# Patient Record
Sex: Female | Born: 1965 | Race: Asian | Hispanic: No | Marital: Married | Smoking: Never smoker
Health system: Southern US, Community
[De-identification: ages and names within clinical notes are randomized; demographics above are authoritative.]

## PROBLEM LIST (undated history)

## (undated) DIAGNOSIS — I8390 Asymptomatic varicose veins of unspecified lower extremity: Secondary | ICD-10-CM

## (undated) HISTORY — DX: Asymptomatic varicose veins of unspecified lower extremity: I83.90

---

## 2004-06-19 ENCOUNTER — Other Ambulatory Visit: Admission: RE | Admit: 2004-06-19 | Discharge: 2004-06-19 | Payer: Self-pay | Admitting: Internal Medicine

## 2005-08-28 ENCOUNTER — Other Ambulatory Visit: Admission: RE | Admit: 2005-08-28 | Discharge: 2005-08-28 | Payer: Self-pay | Admitting: Obstetrics and Gynecology

## 2006-09-03 ENCOUNTER — Other Ambulatory Visit: Admission: RE | Admit: 2006-09-03 | Discharge: 2006-09-03 | Payer: Self-pay | Admitting: Obstetrics and Gynecology

## 2007-09-10 ENCOUNTER — Other Ambulatory Visit: Admission: RE | Admit: 2007-09-10 | Discharge: 2007-09-10 | Payer: Self-pay | Admitting: Obstetrics and Gynecology

## 2007-10-13 ENCOUNTER — Encounter: Admission: RE | Admit: 2007-10-13 | Discharge: 2007-10-13 | Payer: Self-pay | Admitting: Obstetrics and Gynecology

## 2008-08-26 ENCOUNTER — Other Ambulatory Visit: Admission: RE | Admit: 2008-08-26 | Discharge: 2008-08-26 | Payer: Self-pay | Admitting: Obstetrics and Gynecology

## 2008-10-13 ENCOUNTER — Encounter: Admission: RE | Admit: 2008-10-13 | Discharge: 2008-10-13 | Payer: Self-pay | Admitting: Obstetrics and Gynecology

## 2009-08-31 ENCOUNTER — Other Ambulatory Visit: Admission: RE | Admit: 2009-08-31 | Discharge: 2009-08-31 | Payer: Self-pay | Admitting: Obstetrics and Gynecology

## 2009-10-14 ENCOUNTER — Encounter: Admission: RE | Admit: 2009-10-14 | Discharge: 2009-10-14 | Payer: Self-pay | Admitting: Obstetrics and Gynecology

## 2010-07-10 ENCOUNTER — Encounter: Payer: Self-pay | Admitting: Cardiology

## 2010-07-10 DIAGNOSIS — I471 Supraventricular tachycardia: Secondary | ICD-10-CM | POA: Insufficient documentation

## 2010-08-13 ENCOUNTER — Encounter: Payer: Self-pay | Admitting: Obstetrics and Gynecology

## 2010-08-24 NOTE — Miscellaneous (Signed)
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

## 2010-09-11 ENCOUNTER — Other Ambulatory Visit: Payer: Self-pay | Admitting: Obstetrics and Gynecology

## 2010-09-11 DIAGNOSIS — Z1231 Encounter for screening mammogram for malignant neoplasm of breast: Secondary | ICD-10-CM

## 2010-10-18 ENCOUNTER — Ambulatory Visit
Admission: RE | Admit: 2010-10-18 | Discharge: 2010-10-18 | Disposition: A | Discharge status: Home / Self Care | Payer: BC Managed Care – PPO | Source: Ambulatory Visit | Attending: Obstetrics and Gynecology | Admitting: Obstetrics and Gynecology

## 2010-10-18 DIAGNOSIS — Z1231 Encounter for screening mammogram for malignant neoplasm of breast: Secondary | ICD-10-CM

## 2011-09-10 ENCOUNTER — Other Ambulatory Visit: Payer: Self-pay | Admitting: Internal Medicine

## 2011-09-10 DIAGNOSIS — Z1231 Encounter for screening mammogram for malignant neoplasm of breast: Secondary | ICD-10-CM

## 2011-10-30 ENCOUNTER — Ambulatory Visit
Admission: RE | Admit: 2011-10-30 | Discharge: 2011-10-30 | Disposition: A | Discharge status: Home / Self Care | Payer: BC Managed Care – PPO | Source: Ambulatory Visit | Attending: Internal Medicine | Admitting: Internal Medicine

## 2011-10-30 DIAGNOSIS — Z1231 Encounter for screening mammogram for malignant neoplasm of breast: Secondary | ICD-10-CM

## 2011-11-02 ENCOUNTER — Other Ambulatory Visit: Payer: Self-pay | Admitting: Internal Medicine

## 2011-11-02 DIAGNOSIS — R928 Other abnormal and inconclusive findings on diagnostic imaging of breast: Secondary | ICD-10-CM

## 2011-11-06 ENCOUNTER — Ambulatory Visit
Admission: RE | Admit: 2011-11-06 | Discharge: 2011-11-06 | Disposition: A | Discharge status: Home / Self Care | Payer: BC Managed Care – PPO | Source: Ambulatory Visit | Attending: Internal Medicine | Admitting: Internal Medicine

## 2011-11-06 DIAGNOSIS — R928 Other abnormal and inconclusive findings on diagnostic imaging of breast: Secondary | ICD-10-CM

## 2012-10-09 ENCOUNTER — Other Ambulatory Visit: Payer: Self-pay

## 2012-10-09 DIAGNOSIS — Z1231 Encounter for screening mammogram for malignant neoplasm of breast: Secondary | ICD-10-CM

## 2012-11-07 ENCOUNTER — Ambulatory Visit
Admission: RE | Admit: 2012-11-07 | Discharge: 2012-11-07 | Disposition: A | Discharge status: Home / Self Care | Payer: BC Managed Care – PPO | Source: Ambulatory Visit

## 2012-11-07 DIAGNOSIS — Z1231 Encounter for screening mammogram for malignant neoplasm of breast: Secondary | ICD-10-CM

## 2013-07-06 ENCOUNTER — Other Ambulatory Visit: Payer: Self-pay | Admitting: *Deleted

## 2013-07-06 DIAGNOSIS — I83893 Varicose veins of bilateral lower extremities with other complications: Secondary | ICD-10-CM

## 2013-07-08 ENCOUNTER — Encounter: Payer: Self-pay | Admitting: Vascular Surgery

## 2013-07-09 ENCOUNTER — Inpatient Hospital Stay (HOSPITAL_COMMUNITY): Admission: RE | Admit: 2013-07-09 | Payer: BC Managed Care – PPO | Source: Ambulatory Visit

## 2013-07-09 ENCOUNTER — Encounter: Payer: BC Managed Care – PPO | Admitting: Vascular Surgery

## 2015-05-18 ENCOUNTER — Other Ambulatory Visit: Payer: Self-pay | Admitting: *Deleted

## 2015-05-18 DIAGNOSIS — I83893 Varicose veins of bilateral lower extremities with other complications: Secondary | ICD-10-CM

## 2015-05-19 ENCOUNTER — Encounter: Payer: Self-pay | Admitting: Vascular Surgery

## 2015-05-19 ENCOUNTER — Ambulatory Visit (HOSPITAL_COMMUNITY)
Admission: RE | Admit: 2015-05-19 | Discharge: 2015-05-19 | Disposition: A | Discharge status: Home / Self Care | Payer: BLUE CROSS/BLUE SHIELD | Source: Ambulatory Visit | Attending: Vascular Surgery | Admitting: Vascular Surgery

## 2015-05-19 ENCOUNTER — Ambulatory Visit (INDEPENDENT_AMBULATORY_CARE_PROVIDER_SITE_OTHER): Payer: BLUE CROSS/BLUE SHIELD | Admitting: Vascular Surgery

## 2015-05-19 VITALS — BP 111/63 | HR 69 | Ht 63.0 in | Wt 136.7 lb

## 2015-05-19 DIAGNOSIS — I83893 Varicose veins of bilateral lower extremities with other complications: Secondary | ICD-10-CM | POA: Diagnosis not present

## 2015-05-19 DIAGNOSIS — I83813 Varicose veins of bilateral lower extremities with pain: Secondary | ICD-10-CM | POA: Diagnosis not present

## 2015-05-19 NOTE — Progress Notes (Signed)
[facility] [place] HISTORY AND PHYSICAL   History of Present Illness:  Patient is a 49 y.o. year old female who presents for evaluation of symptomatic varicose veins. The patient has had a history of varicose veins for several years. Over time they have gotten larger. She has a burning warm sensation that occurs frequently an almost continuously in the left medial leg. She denies prior history of DVT. She has no family history of varicose veins. She has not worn compression stockings in the past..   Past medical history: No significant medical problems  Past surgical history: No significant surgical history Social History Social History  Substance Use Topics  . Smoking status: Never Smoker   . Smokeless tobacco: None  . Alcohol Use: 0.0 oz/week    0 Standard drinks or equivalent per week    Family History No family history on file.  Allergies  No Known Allergies   No current outpatient prescriptions on file.   No current facility-administered medications for this visit.    ROS:   General:  No weight loss, Fever, chills  HEENT: No recent headaches, no nasal bleeding, no visual changes, no sore throat  Neurologic: No dizziness, blackouts, seizures. No recent symptoms of stroke or mini- stroke. No recent episodes of slurred speech, or temporary blindness.  Cardiac: No recent episodes of chest pain/pressure, no shortness of breath at rest.  No shortness of breath with exertion.  Denies history of atrial fibrillation or irregular heartbeat  Vascular: No history of rest pain in feet.  No history of claudication.  No history of non-healing ulcer, No history of DVT   Pulmonary: No home oxygen, no productive cough, no hemoptysis,  No asthma or wheezing  Musculoskeletal:   Arthritis,  Low back pain,   Joint pain  Hematologic:No history of hypercoagulable state.  No history of easy bleeding.  No history of anemia  Gastrointestinal: No hematochezia  or melena,  No gastroesophageal reflux, no trouble swallowing  Urinary:  chronic Kidney disease,  on HD -  MWF or  TTHS,  Burning with urination,  Frequent urination,  Difficulty urinating;   Skin: No rashes  Psychological: No history of anxiety,  No history of depression   Physical Examination  Filed Vitals:   05/19/15 1318  BP: 111/63  Pulse: 69  Height:  (1.6 m)  Weight: 136 lb 11.2 oz (62.007 kg)  SpO2: 100%    Body mass index is 24.22 kg/(m^2).  General:  Alert and oriented, no acute distress HEENT: Normal Neck: No bruit or JVD Pulmonary: Clear to auscultation bilaterally Cardiac: Regular Rate and Rhythm without murmur Abdomen: Soft, non-tender, non-distended, no mass Skin: No rash, large clusters of varicosities medial knee bilaterally. Left side is or to 6 mm right side is 4 mm cluster is 7-8 cm in diameter left side 4-6 cm in diameter right side Extremity Pulses:  2+ radial, brachial,dorsalis pedis pulses bilaterally Musculoskeletal: No deformity or edema  Neurologic: Upper and lower extremity motor 5/5 and symmetric  DATA:  Patient bilateral lower extremity venous reflux exam today. This showed no evidence of superficial reflux in the lesser saphenous vein bilaterally. No DVT. She did have some mild common femoral vein reflux bilaterally. She had diffuse reflux in the left greater saphenous vein diameter 6-7 mm and as large as 10 mm at the knee level.  She also had some reflux in the right saphenofemoral junction right greater  saphenous vein primary 3 mm diameter   ASSESSMENT:  Symptomatically varicose veins bilaterally with  left sided symptoms and reflux in the left superficial system.   PLAN:  Patient was measured today for bilateral lower extremity thigh high compression stockings 30-40 mmHg. She will wear these daily. She will also elevate her legs at the end of the day. She will follow-up with us in 3 months time for consideration for  laser ablation.  Fabienne Brunsharles Fields, MD [facility] [place] Office: [phone] Pager: [phone]

## 2015-08-23 ENCOUNTER — Ambulatory Visit: Payer: BLUE CROSS/BLUE SHIELD | Admitting: Vascular Surgery

## 2016-04-13 ENCOUNTER — Encounter: Payer: Self-pay | Admitting: Vascular Surgery

## 2016-04-19 ENCOUNTER — Encounter: Payer: Self-pay | Admitting: Vascular Surgery

## 2016-04-19 ENCOUNTER — Ambulatory Visit (INDEPENDENT_AMBULATORY_CARE_PROVIDER_SITE_OTHER): Payer: BLUE CROSS/BLUE SHIELD | Admitting: Vascular Surgery

## 2016-04-19 VITALS — BP 117/83 | HR 61 | Temp 97.8°F | Resp 16 | Ht 64.0 in | Wt 134.0 lb

## 2016-04-19 DIAGNOSIS — I83813 Varicose veins of bilateral lower extremities with pain: Secondary | ICD-10-CM

## 2016-04-19 NOTE — Progress Notes (Signed)
Patient name: Debbie Washington MRN: [id number] DOB: 19-Feb-1966 Sex: female  REASON FOR VISIT: Follow-up of painful venous varicosities left leg  HPI: Debbie Washington is a 50 y.o. female today for follow-up. Seen Dr. Darrick Pennafields in the past for painful left leg venous varicosities. She has been very compliant with her compression garments. She does report that she is still continuing to have discomfort despite this. She has a plexus of varicosities on the medial aspect of her left thigh. She reports a sensation of heat and burning with prolonged standing. She is very active physically and reports that this is worse with physical activity. She does take ibuprofen for discomfort and elevation when possible.  Past Medical History:  Diagnosis Date  . Varicose veins     No family history on file.  SOCIAL HISTORY: Social History  Substance Use Topics  . Smoking status: Never Smoker  . Smokeless tobacco: Never Used  . Alcohol use 0.0 oz/week    No Known Allergies  Current Outpatient Prescriptions  Medication Sig Dispense Refill  . Multiple Vitamin (MULTIVITAMIN) tablet Take 1 tablet by mouth daily.     No current facility-administered medications for this visit.     REVIEW OF SYSTEMS:  [X]  denotes positive finding, [ ]  denotes negative finding Cardiac  Comments:  Chest pain or chest pressure:    Shortness of breath upon exertion:    Short of breath when lying flat:    Irregular heart rhythm:        Vascular    Pain in calf, thigh, or hip brought on by ambulation:    Pain in feet at night that wakes you up from your sleep:     Blood clot in your veins:    Leg swelling:         Pulmonary    Oxygen at home:    Productive cough:     Wheezing:         Neurologic    Sudden weakness in arms or legs:     Sudden numbness in arms or legs:     Sudden onset of difficulty speaking or slurred speech:    Temporary loss of vision in one eye:     Problems with dizziness:          Gastrointestinal    Blood in stool:     Vomited blood:         Genitourinary    Burning when urinating:     Blood in urine:        Psychiatric    Major depression:         Hematologic    Bleeding problems:    Problems with blood clotting too easily:        Skin    Rashes or ulcers:        Constitutional    Fever or chills:      PHYSICAL EXAM: Vitals:   04/19/16 0938  BP: 117/83  Pulse: 61  Resp: 16  Temp: 97.8 F (36.6 C)  SpO2: 98%  Weight: 60.8 kg (134 lb)  Height: 5\' 4"  (1.626 m)    GENERAL: The patient is a well-nourished female, in no acute distress. The vital signs are documented above. 2+ dorsalis pedis pulses bilaterally. Respirations nonlabored She does have Varicosities in the medial aspect of her left calf. No changes of chronic venous hypertension distally.  DATA:   I did reimage her SonoSite ultrasound and compared to this a formal venous duplex in the left  leg. This does show an enlarged great saphenous vein with reflux into this large plexus of varicosities in the medial aspect of her calf.  MEDICAL ISSUES:  Conservative treatment for venous hypertension. Have recommended laser ablation of her left great saphenous vein or symptom relief. She understands this as an outpatient procedure in our office under local anesthetic. She wishes to proceed as soon as possible   Early, Sales promotion account executive of [employer] [phone]

## 2016-04-27 ENCOUNTER — Other Ambulatory Visit: Payer: Self-pay | Admitting: *Deleted

## 2016-04-27 DIAGNOSIS — I83813 Varicose veins of bilateral lower extremities with pain: Secondary | ICD-10-CM

## 2016-05-30 ENCOUNTER — Encounter: Payer: Self-pay | Admitting: Vascular Surgery

## 2016-05-31 ENCOUNTER — Encounter: Payer: Self-pay | Admitting: Vascular Surgery

## 2016-05-31 ENCOUNTER — Ambulatory Visit (INDEPENDENT_AMBULATORY_CARE_PROVIDER_SITE_OTHER): Payer: BLUE CROSS/BLUE SHIELD | Admitting: Vascular Surgery

## 2016-05-31 VITALS — BP 107/71 | HR 62 | Temp 98.5°F | Resp 16 | Ht 63.0 in | Wt 136.0 lb

## 2016-05-31 DIAGNOSIS — I83892 Varicose veins of left lower extremities with other complications: Secondary | ICD-10-CM | POA: Diagnosis not present

## 2016-05-31 HISTORY — PX: ENDOVENOUS ABLATION SAPHENOUS VEIN W/ LASER: SUR449

## 2016-05-31 NOTE — Progress Notes (Signed)
     Laser Ablation Procedure    Date: 05/31/2016   Debbie Washington DOB:13-Mar-1966  Consent signed: Yes    Surgeon:  Dr. Tawanna Coolerodd Weslie Rasmus  Procedure: Laser Ablation: left Greater Saphenous Vein  BP 107/71 (BP Location: Left Arm, Patient Position: Sitting, Cuff Size: Normal)   Pulse 62   Temp 98.5 F (36.9 C) (Oral)   Resp 16   Ht 5\' 3"  (1.6 m)   Wt 136 lb (61.7 kg)   SpO2 98%   BMI 24.09 kg/m   Tumescent Anesthesia: 450 cc 0.9% NaCl with 50 cc Lidocaine HCL with 1% Epi and 15 cc 8.4% NaHCO3  Local Anesthesia: 2 cc Lidocaine HCL and NaHCO3 (ratio 2:1)  15 watts continuous mode        Total energy: 2494 Joules    Total time: 2:46     Stab Phlebectomy: <10 incisions Sites: Calf left  Patient tolerated procedure well    Description of Procedure:  After marking the course of the secondary varicosities, the patient was placed on the operating table in the supine position, and the left leg was prepped and draped in sterile fashion.   Local anesthetic was administered and under ultrasound guidance the saphenous vein was accessed with a micro needle and guide wire; then the mirco puncture sheath was placed.  A guide wire was inserted saphenofemoral junction , followed by a 5 french sheath.  The position of the sheath and then the laser fiber below the junction was confirmed using the ultrasound.  Tumescent anesthesia was administered along the course of the saphenous vein using ultrasound guidance. The patient was placed in Trendelenburg position and protective laser glasses were placed on patient and staff, and the laser was fired at 15 watts continuous mode advancing 1-512mm/second for a total of 2494 joules.   For stab phlebectomies, local anesthetic was administered at the previously marked varicosities, and tumescent anesthesia was administered around the vessels.  Less than ten incisions left calf stab wounds were made using the tip of an 11 blade. And using the vein hook, the  phlebectomies were performed using a hemostat to avulse the varicosities.  Adequate hemostasis was achieved.     Steri strips were applied to the stab wounds and ABD pads and thigh high compression stockings were applied.  Ace wrap bandages were applied over the phlebectomy sites and at the top of the saphenofemoral junction. Blood loss was less than 15 cc.  The patient ambulated out of the operating room having tolerated the procedure well.  Uneventful ablation from proximal calf to saphenofemoral junction and stab phlebectomy of several prominent varices at her medial proximal calf. Follow-up in one week

## 2016-06-01 ENCOUNTER — Telehealth: Payer: Self-pay | Admitting: *Deleted

## 2016-06-01 NOTE — Telephone Encounter (Signed)
    06/01/2016  Time: 10:40 AM   Patient Name: Debbie Washington  Patient of: T.F. Early  Procedure:Laser Ablation left greater saphenous vein 05-31-2016  Reached patient at home and checked  Her status  Yes    Comments/Actions Taken: [name] states no problems with left leg pain, swelling, or bleeding/oozing from stab phlebectomy incision sites. Reviewed post procedural instructions with her and reminded her of post laser ablation duplex and follow up appointment with Dr. Arbie CookeyEarly on 06-12-2016.       @SIGNATURE @

## 2016-06-06 ENCOUNTER — Encounter: Payer: Self-pay | Admitting: Vascular Surgery

## 2016-06-12 ENCOUNTER — Ambulatory Visit (HOSPITAL_COMMUNITY)
Admission: RE | Admit: 2016-06-12 | Discharge: 2016-06-12 | Disposition: A | Discharge status: Home / Self Care | Payer: BLUE CROSS/BLUE SHIELD | Source: Ambulatory Visit | Attending: Vascular Surgery | Admitting: Vascular Surgery

## 2016-06-12 ENCOUNTER — Encounter: Payer: Self-pay | Admitting: Vascular Surgery

## 2016-06-12 ENCOUNTER — Ambulatory Visit (INDEPENDENT_AMBULATORY_CARE_PROVIDER_SITE_OTHER): Payer: BLUE CROSS/BLUE SHIELD | Admitting: Vascular Surgery

## 2016-06-12 VITALS — BP 106/73 | HR 66 | Temp 98.6°F | Resp 18 | Ht 63.0 in | Wt 134.4 lb

## 2016-06-12 DIAGNOSIS — Z9889 Other specified postprocedural states: Secondary | ICD-10-CM | POA: Insufficient documentation

## 2016-06-12 DIAGNOSIS — I83813 Varicose veins of bilateral lower extremities with pain: Secondary | ICD-10-CM | POA: Insufficient documentation

## 2016-06-12 DIAGNOSIS — I83892 Varicose veins of left lower extremities with other complications: Secondary | ICD-10-CM

## 2016-06-12 NOTE — Progress Notes (Signed)
    [facility]  Patient name: Debbie Washington Faulk MRN: [id number] DOB: [birth year] Sex: female  REASON FOR VISIT: Follow up laser ablation left great saphenous vein stab phlebectomy several varicosities in her medial calf on 05/31/2016  HPI: Debbie Washington Damas is a 50 y.o. female here today for follow-up. She's been very compliant with her compression garment. She reports minimal discomfort associated with the procedure.  Past Medical History:  Diagnosis Date  . Varicose veins     History reviewed. No pertinent family history.  SOCIAL HISTORY: Social History  Substance Use Topics  . Smoking status: Never Smoker  . Smokeless tobacco: Never Used  . Alcohol use 0.0 oz/week    No Known Allergies  Current Outpatient Prescriptions  Medication Sig Dispense Refill  . Multiple Vitamin (MULTIVITAMIN) tablet Take 1 tablet by mouth daily.     No current facility-administered medications for this visit.     REVIEW OF SYSTEMS:  [X]  denotes positive finding, [ ]  denotes negative finding Cardiac  Comments:  Chest pain or chest pressure:    Shortness of breath upon exertion:    Short of breath when lying flat:    Irregular heart rhythm:        Vascular    Pain in calf, thigh, or hip brought on by ambulation:    Pain in feet at night that wakes you up from your sleep:     Blood clot in your veins:    Leg swelling:           PHYSICAL EXAM: Vitals:   06/12/16 1447  BP: 106/73  Pulse: 66  Resp: 18  Temp: 98.6 F (37 C)  TempSrc: Oral  SpO2: 99%  Weight: 134 lb 6.4 oz (61 kg)  Height: 5\' 3"  (1.6 m)    GENERAL: The patient is a well-nourished female, in no acute distress. The vital signs are documented above. CARDIOVASCULAR: Dorsalis pedis pulse PULMONARY: There is good air exchange  MUSCULOSKELETAL: There are no major deformities or cyanosis. NEUROLOGIC: No focal weakness or paresthesias are  detected. SKIN: There are no ulcers or rashes noted. PSYCHIATRIC: The patient has a normal affect. She does have Steri-Strips over the phlebectomy site. Minimal bruising. Some thickening over the ablation.   DATA:  Duplex shows closure of her great saphenous vein from the proximal calf to 1.2 cm from the saphenofemoral junction and no evidence of DVT  MEDICAL ISSUES: Successful treatment of these are ablation left great saphenous vein. I she will continue her compression garment for 2 more days for a total of 2 weeks and then as needed. Plan see the patient again on an as-needed basis    Larina Earthlyodd F. Ruari Duggan, MD [facility] Office Tel [phone] Pager [phone]

## 2021-03-15 ENCOUNTER — Other Ambulatory Visit: Payer: Self-pay | Admitting: Obstetrics and Gynecology

## 2021-03-15 DIAGNOSIS — R928 Other abnormal and inconclusive findings on diagnostic imaging of breast: Secondary | ICD-10-CM

## 2021-03-22 ENCOUNTER — Ambulatory Visit
Admission: RE | Admit: 2021-03-22 | Discharge: 2021-03-22 | Disposition: A | Discharge status: Home / Self Care | Payer: Self-pay | Source: Ambulatory Visit | Attending: Obstetrics and Gynecology | Admitting: Obstetrics and Gynecology

## 2021-03-22 ENCOUNTER — Ambulatory Visit
Admission: RE | Admit: 2021-03-22 | Discharge: 2021-03-22 | Disposition: A | Discharge status: Home / Self Care | Payer: BC Managed Care – PPO | Source: Ambulatory Visit | Attending: Obstetrics and Gynecology | Admitting: Obstetrics and Gynecology

## 2021-03-22 ENCOUNTER — Other Ambulatory Visit: Payer: Self-pay

## 2021-03-22 DIAGNOSIS — R928 Other abnormal and inconclusive findings on diagnostic imaging of breast: Secondary | ICD-10-CM

## 2021-05-26 ENCOUNTER — Other Ambulatory Visit: Payer: Self-pay | Admitting: Registered Nurse

## 2021-05-26 DIAGNOSIS — Z0000 Encounter for general adult medical examination without abnormal findings: Secondary | ICD-10-CM

## 2021-06-19 ENCOUNTER — Ambulatory Visit
Admission: RE | Admit: 2021-06-19 | Discharge: 2021-06-19 | Disposition: A | Discharge status: Home / Self Care | Payer: No Typology Code available for payment source | Source: Ambulatory Visit | Attending: Registered Nurse | Admitting: Registered Nurse

## 2021-06-19 DIAGNOSIS — Z0000 Encounter for general adult medical examination without abnormal findings: Secondary | ICD-10-CM

## 2022-05-31 ENCOUNTER — Other Ambulatory Visit: Payer: Self-pay | Admitting: Obstetrics and Gynecology

## 2022-05-31 DIAGNOSIS — R928 Other abnormal and inconclusive findings on diagnostic imaging of breast: Secondary | ICD-10-CM

## 2022-06-21 ENCOUNTER — Ambulatory Visit
Admission: RE | Admit: 2022-06-21 | Discharge: 2022-06-21 | Disposition: A | Discharge status: Home / Self Care | Payer: BC Managed Care – PPO | Source: Ambulatory Visit | Attending: Obstetrics and Gynecology | Admitting: Obstetrics and Gynecology

## 2022-06-21 ENCOUNTER — Ambulatory Visit: Payer: BC Managed Care – PPO

## 2022-06-21 DIAGNOSIS — R928 Other abnormal and inconclusive findings on diagnostic imaging of breast: Secondary | ICD-10-CM

## 2022-09-04 IMAGING — CT CT CARDIAC CORONARY ARTERY CALCIUM SCORE
2 of 3 series · 10 of 20 positions shown, 12 images · non-contrast
Comparison: None.

CLINICAL DATA: Family history of heart disease. Nonsmoker. History
of left-sided mastitis.

EXAM:
CT CARDIAC CORONARY ARTERY CALCIUM SCORE
TECHNIQUE: Non-contrast imaging through the heart was performed using
prospective ECG gating. Image post processing was performed on an
independent workstation, allowing for quantitative analysis of the
heart and coronary arteries. Note that this exam targets the heart
and the chest was not imaged in its entirety.

[Series 3: calcium scoring 2.00 br40 bestdiast 71% axial · axial · 0.47mm/px · z∈[+1773,+1877]mm · 5 of 80 slices shown, 7 images]
[im 14/80  vessel]
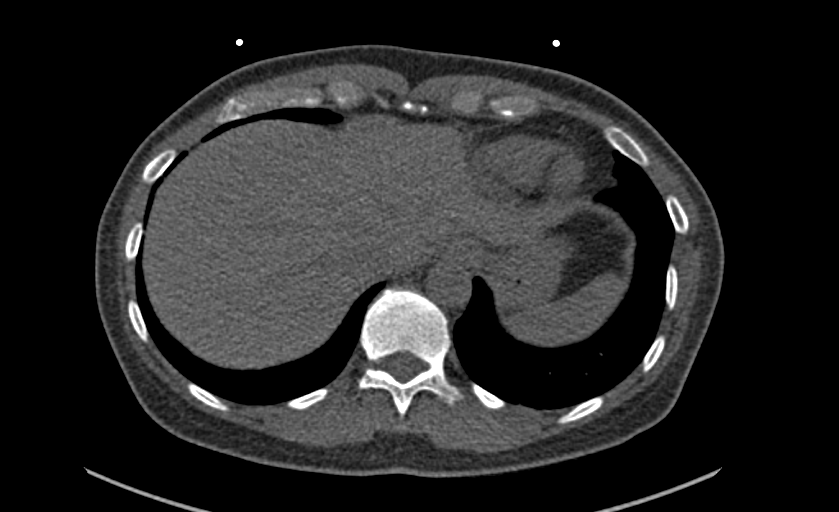
[im 14/80  lung]
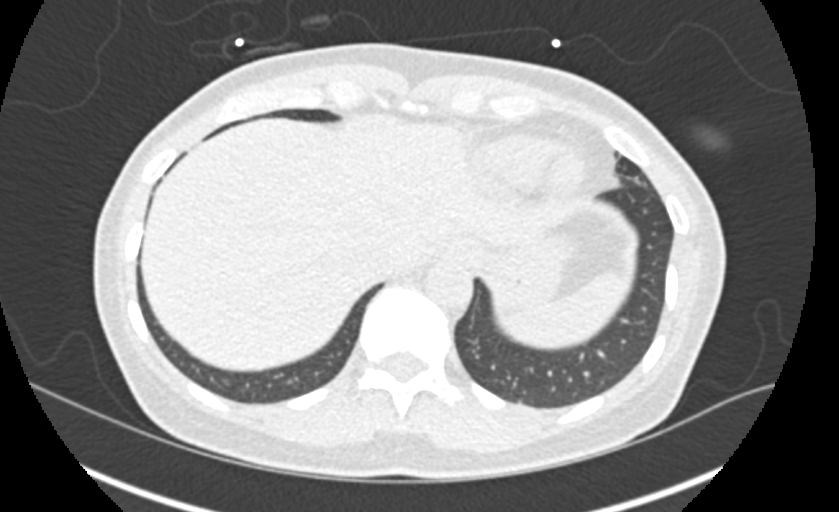
[im 27/80  vessel]
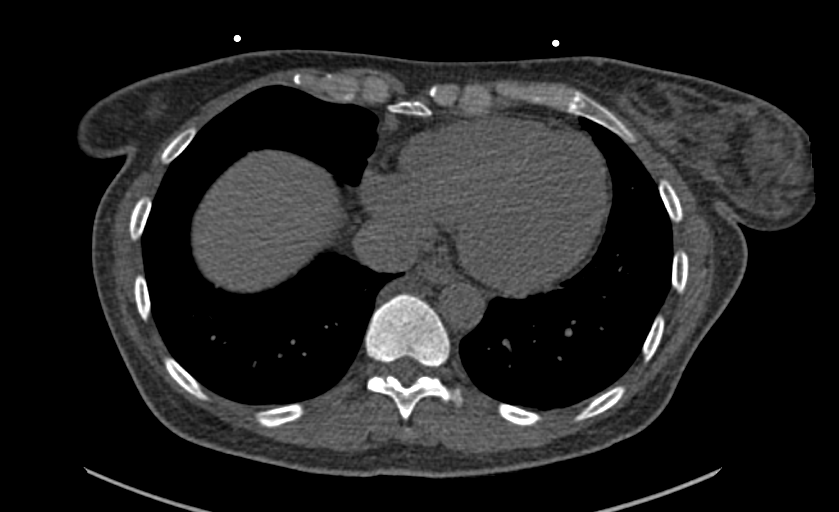
[im 40/80  vessel]
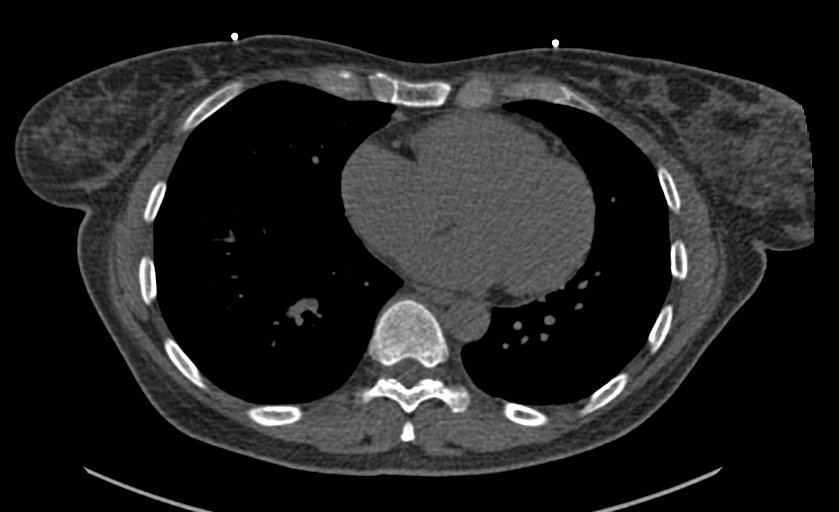
[im 53/80  vessel]
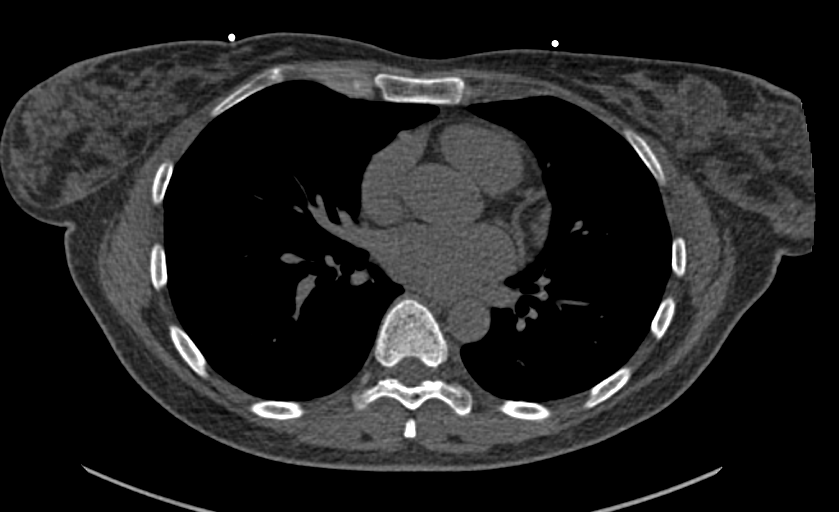
[im 66/80  vessel]
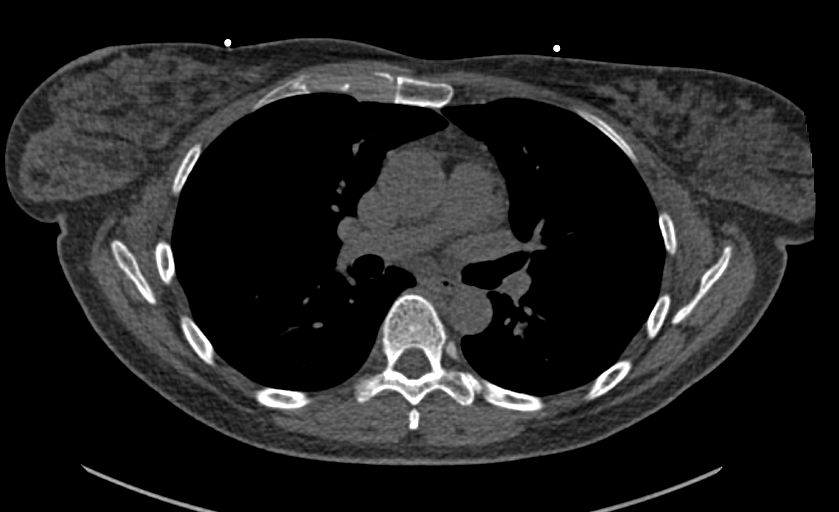
[im 66/80  lung]
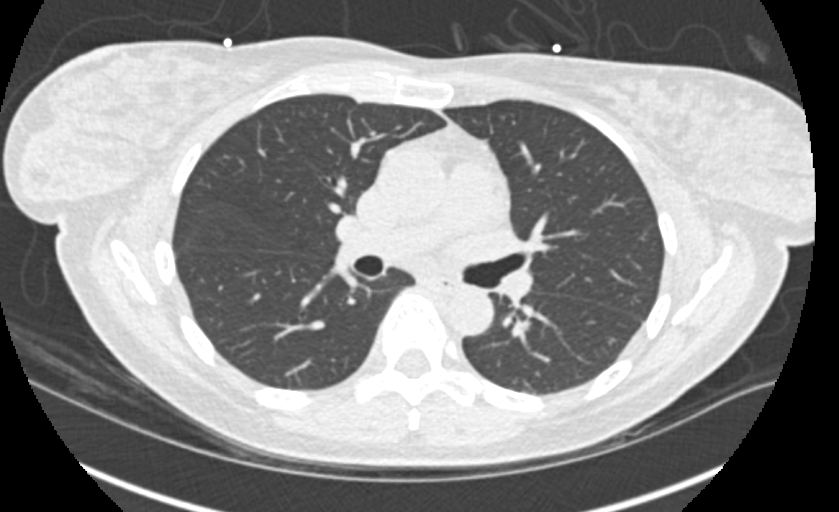

[Series 9: calcium scoring 2.00 br60 bestdiast 71% lungs · axial · 0.47mm/px · z∈[+1773,+1877]mm · 5 of 80 slices shown]
[im 14/80  vessel]
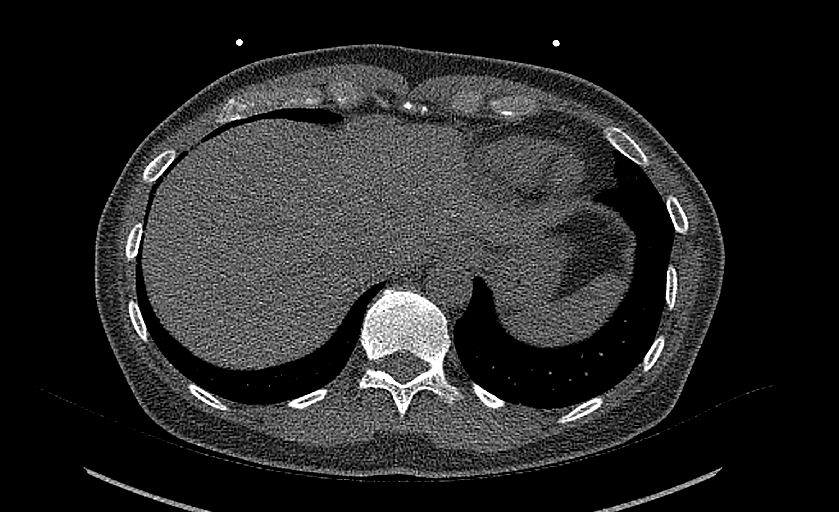
[im 27/80  vessel]
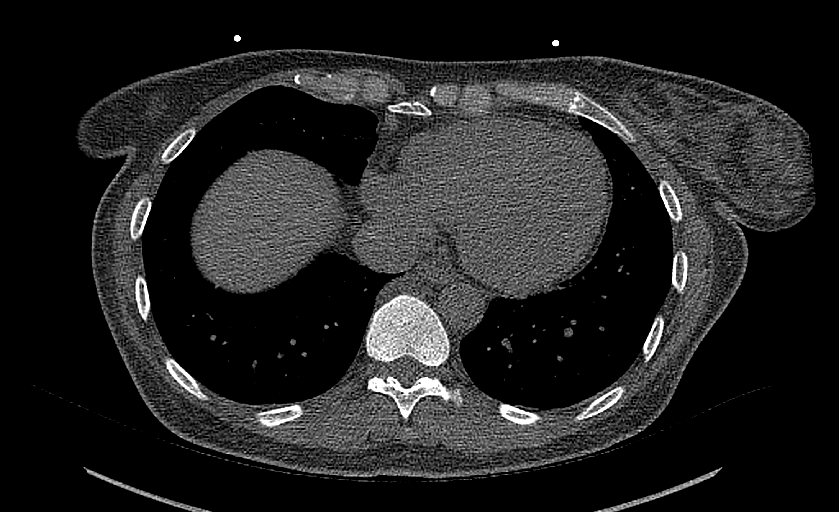
[im 40/80  vessel]
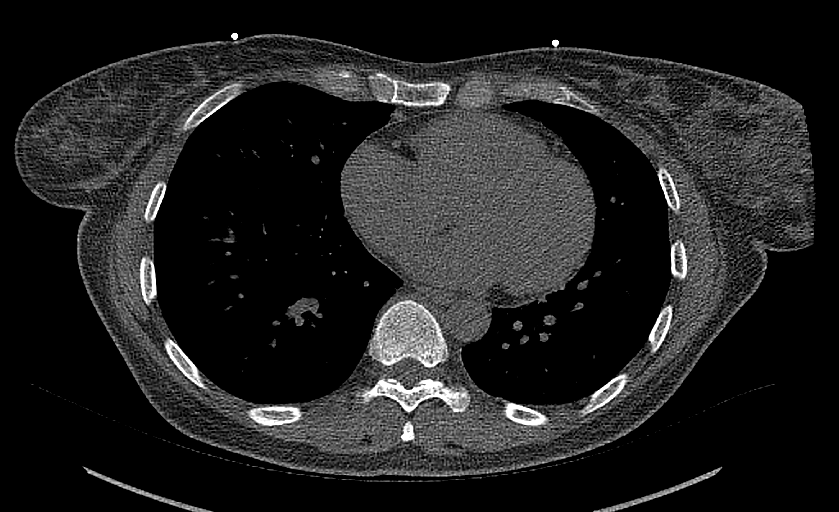
[im 53/80  vessel]
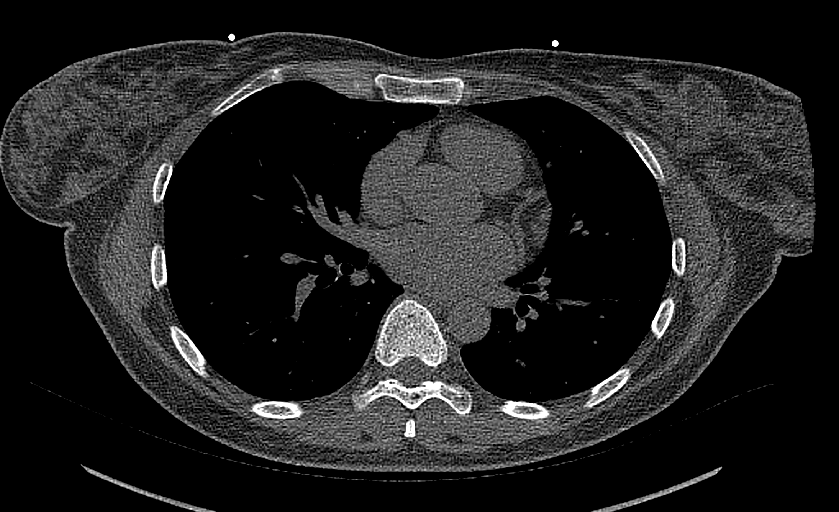
[im 66/80  vessel]
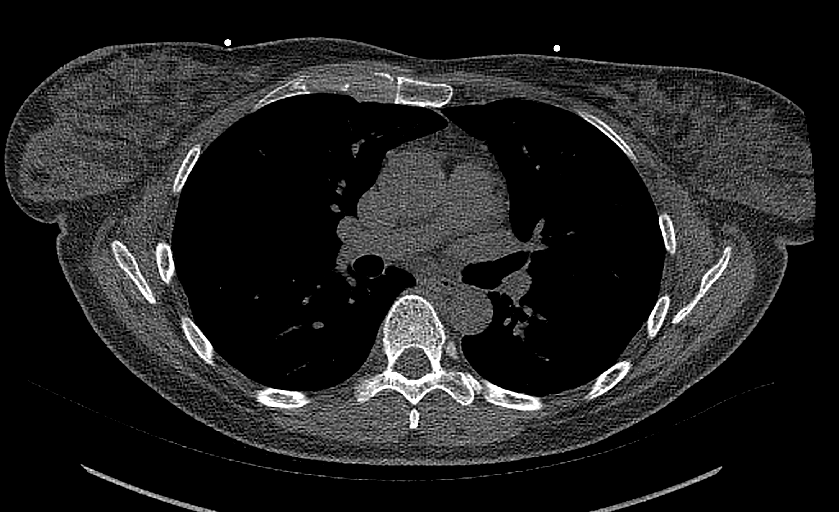

[10 of 20 positions shown; findings below may reference images not displayed]

FINDINGS: CORONARY CALCIUM SCORES:

Total Agatston Score: 0- No coronary calcium identified.

AORTA MEASUREMENTS:

Ascending Aorta: 28 mm

Descending Aorta: 21 mm

OTHER FINDINGS:

Cardiovascular: Normal aortic caliber. Normal heart size, without
pericardial effusion.

Mediastinum/Nodes: No imaged thoracic adenopathy.

Lungs/Pleura: No pleural fluid.  Clear imaged lungs.

Upper Abdomen: Normal imaged portions of the liver, spleen, stomach.

Musculoskeletal: No acute osseous abnormality.
IMPRESSION: 1. No coronary calcium identified.
2. No acute process in the imaged chest.
